# Patient Record
Sex: Female | Born: 1950 | Race: White | Hispanic: No | Marital: Married | Smoking: Former smoker
Health system: Southern US, Community
[De-identification: ages and names within clinical notes are randomized; demographics above are authoritative.]

## PROBLEM LIST (undated history)

## (undated) DIAGNOSIS — M25649 Stiffness of unspecified hand, not elsewhere classified: Secondary | ICD-10-CM

## (undated) DIAGNOSIS — E559 Vitamin D deficiency, unspecified: Secondary | ICD-10-CM

## (undated) DIAGNOSIS — E785 Hyperlipidemia, unspecified: Secondary | ICD-10-CM

## (undated) DIAGNOSIS — R209 Unspecified disturbances of skin sensation: Secondary | ICD-10-CM

## (undated) HISTORY — DX: Vitamin D deficiency, unspecified: E55.9

## (undated) HISTORY — DX: Unspecified disturbances of skin sensation: R20.9

## (undated) HISTORY — DX: Stiffness of unspecified hand, not elsewhere classified: M25.649

## (undated) HISTORY — DX: Hyperlipidemia, unspecified: E78.5

---

## 2012-05-21 ENCOUNTER — Ambulatory Visit (INDEPENDENT_AMBULATORY_CARE_PROVIDER_SITE_OTHER): Payer: BC Managed Care – PPO | Admitting: Internal Medicine

## 2012-05-21 DIAGNOSIS — R0602 Shortness of breath: Secondary | ICD-10-CM

## 2012-05-21 LAB — PULMONARY FUNCTION TEST

## 2012-05-21 NOTE — Progress Notes (Signed)
PFT done today. 

## 2012-06-03 ENCOUNTER — Encounter: Payer: Self-pay | Admitting: Internal Medicine

## 2013-06-08 ENCOUNTER — Other Ambulatory Visit (HOSPITAL_COMMUNITY): Payer: Self-pay | Admitting: Family Medicine

## 2013-06-08 DIAGNOSIS — Z1231 Encounter for screening mammogram for malignant neoplasm of breast: Secondary | ICD-10-CM

## 2013-07-03 ENCOUNTER — Ambulatory Visit (HOSPITAL_COMMUNITY)
Admission: RE | Admit: 2013-07-03 | Discharge: 2013-07-03 | Disposition: A | Discharge status: Home / Self Care | Payer: BC Managed Care – PPO | Source: Ambulatory Visit | Attending: Family Medicine | Admitting: Family Medicine

## 2013-07-03 DIAGNOSIS — Z1231 Encounter for screening mammogram for malignant neoplasm of breast: Secondary | ICD-10-CM | POA: Insufficient documentation

## 2013-12-18 ENCOUNTER — Encounter: Payer: Self-pay | Admitting: *Deleted

## 2015-03-03 ENCOUNTER — Other Ambulatory Visit: Payer: Self-pay | Admitting: Family Medicine

## 2015-03-03 DIAGNOSIS — R221 Localized swelling, mass and lump, neck: Secondary | ICD-10-CM

## 2015-03-15 ENCOUNTER — Ambulatory Visit
Admission: RE | Admit: 2015-03-15 | Discharge: 2015-03-15 | Disposition: A | Discharge status: Home / Self Care | Payer: BLUE CROSS/BLUE SHIELD | Source: Ambulatory Visit | Attending: Family Medicine | Admitting: Family Medicine

## 2015-03-15 DIAGNOSIS — R221 Localized swelling, mass and lump, neck: Secondary | ICD-10-CM

## 2015-04-02 ENCOUNTER — Other Ambulatory Visit: Payer: Self-pay | Admitting: Family Medicine

## 2015-04-02 DIAGNOSIS — E041 Nontoxic single thyroid nodule: Secondary | ICD-10-CM

## 2015-04-06 ENCOUNTER — Ambulatory Visit
Admission: RE | Admit: 2015-04-06 | Discharge: 2015-04-06 | Disposition: A | Discharge status: Home / Self Care | Payer: BLUE CROSS/BLUE SHIELD | Source: Ambulatory Visit | Attending: Family Medicine | Admitting: Family Medicine

## 2015-04-06 ENCOUNTER — Other Ambulatory Visit (HOSPITAL_COMMUNITY)
Admission: RE | Admit: 2015-04-06 | Discharge: 2015-04-06 | Disposition: A | Discharge status: Home / Self Care | Payer: BLUE CROSS/BLUE SHIELD | Source: Ambulatory Visit | Attending: Radiology | Admitting: Radiology

## 2015-04-06 DIAGNOSIS — E041 Nontoxic single thyroid nodule: Secondary | ICD-10-CM | POA: Diagnosis not present

## 2015-04-19 ENCOUNTER — Other Ambulatory Visit: Payer: Self-pay | Admitting: Family Medicine

## 2015-04-19 DIAGNOSIS — E041 Nontoxic single thyroid nodule: Secondary | ICD-10-CM

## 2015-05-18 ENCOUNTER — Ambulatory Visit
Admission: RE | Admit: 2015-05-18 | Discharge: 2015-05-18 | Disposition: A | Discharge status: Home / Self Care | Payer: BLUE CROSS/BLUE SHIELD | Source: Ambulatory Visit | Attending: Family Medicine | Admitting: Family Medicine

## 2015-05-18 ENCOUNTER — Other Ambulatory Visit: Payer: Self-pay | Admitting: Family Medicine

## 2015-05-18 DIAGNOSIS — E041 Nontoxic single thyroid nodule: Secondary | ICD-10-CM

## 2016-03-13 ENCOUNTER — Other Ambulatory Visit: Payer: Self-pay | Admitting: Family Medicine

## 2016-03-13 DIAGNOSIS — E041 Nontoxic single thyroid nodule: Secondary | ICD-10-CM

## 2021-01-11 ENCOUNTER — Emergency Department (HOSPITAL_COMMUNITY): Payer: Medicare Other

## 2021-01-11 ENCOUNTER — Encounter (HOSPITAL_COMMUNITY): Payer: Self-pay

## 2021-01-11 ENCOUNTER — Other Ambulatory Visit: Payer: Self-pay

## 2021-01-11 ENCOUNTER — Emergency Department (HOSPITAL_COMMUNITY)
Admission: EM | Admit: 2021-01-11 | Discharge: 2021-01-11 | Disposition: A | Discharge status: Home / Self Care | Payer: Medicare Other | Attending: Emergency Medicine | Admitting: Emergency Medicine

## 2021-01-11 ENCOUNTER — Other Ambulatory Visit: Payer: Self-pay | Admitting: Physician Assistant

## 2021-01-11 ENCOUNTER — Emergency Department (INDEPENDENT_AMBULATORY_CARE_PROVIDER_SITE_OTHER): Payer: Medicare Other

## 2021-01-11 DIAGNOSIS — R0602 Shortness of breath: Secondary | ICD-10-CM | POA: Diagnosis present

## 2021-01-11 DIAGNOSIS — R002 Palpitations: Secondary | ICD-10-CM | POA: Insufficient documentation

## 2021-01-11 DIAGNOSIS — Z87891 Personal history of nicotine dependence: Secondary | ICD-10-CM | POA: Diagnosis not present

## 2021-01-11 DIAGNOSIS — R42 Dizziness and giddiness: Secondary | ICD-10-CM | POA: Diagnosis not present

## 2021-01-11 LAB — BASIC METABOLIC PANEL
Anion gap: 7 (ref 5–15)
BUN: 15 mg/dL (ref 8–23)
CO2: 25 mmol/L (ref 22–32)
Calcium: 9.2 mg/dL (ref 8.9–10.3)
Chloride: 106 mmol/L (ref 98–111)
Creatinine, Ser: 0.65 mg/dL (ref 0.44–1.00)
GFR, Estimated: >60 mL/min (ref >60)
Glucose, Bld: 109 mg/dL — ABNORMAL HIGH (ref 70–99)
Potassium: 4.4 mmol/L (ref 3.5–5.1)
Sodium: 138 mmol/L (ref 135–145)

## 2021-01-11 LAB — CBC
HCT: 41.2 % (ref 36.0–46.0)
Hemoglobin: 13.5 g/dL (ref 12.0–15.0)
MCH: 31.5 pg (ref 26.0–34.0)
MCHC: 32.8 g/dL (ref 30.0–36.0)
MCV: 96 fL (ref 80.0–100.0)
Platelets: 216 10*3/uL (ref 150–400)
RBC: 4.29 MIL/uL (ref 3.87–5.11)
RDW: 12.7 % (ref 11.5–15.5)
WBC: 6.8 10*3/uL (ref 4.0–10.5)
nRBC: 0 % (ref 0.0–0.2)

## 2021-01-11 LAB — TROPONIN I (HIGH SENSITIVITY)
Troponin I (High Sensitivity): 4 ng/L (ref <18)
Troponin I (High Sensitivity): 3 ng/L (ref <18)

## 2021-01-11 LAB — TSH: TSH: 1.399 u[IU]/mL (ref 0.350–4.500)

## 2021-01-11 LAB — MAGNESIUM: Magnesium: 2.1 mg/dL (ref 1.7–2.4)

## 2021-01-11 NOTE — Progress Notes (Unsigned)
Enrolled patient for a 14 day Zio XT monitor to be mailed to: [address] [place] [address]  New patient appt being set up

## 2021-01-11 NOTE — ED Provider Notes (Signed)
[facility] EMERGENCY DEPARTMENT Provider Note   CSN: [id number] Arrival date & time: 01/11/21  4098     History No chief complaint on file.   Rita Petersen is a 70 y.o. female.  HPI Patient is a 69 year old female she states that she has a past medical history of vitamin D deficiency but no other medical problems.  She denies any history of heart disease, heart problems, thyroid problems, no history of heart failure.  She states that for the past 3 months she has been having episodes of heart fluttering sensation.  She states that this goes along with some shortness of breath that she experiences with for her palpitations.  She states that she is having symptoms of heart palpitations/heart fluttering sensations on a nearly daily basis.  Has not passed out but does occasionally feel lightheaded.  Denies any nausea vomiting or chest pain.  She states that she has not seen her primary care provider in approximately 5 years.  States that she does not have any medical problems however and always had reassuring labs and vital signs.  She has never been on blood pressure medicine.  She denies any history of strokes or heart attacks or other heart disease.     Past Medical History:  Diagnosis Date   Disturbance of skin sensation    Other and unspecified hyperlipidemia    Stiffness of joint, not elsewhere classified, hand    Unspecified vitamin D deficiency     There are no problems to display for this patient.   History reviewed. No pertinent surgical history.   OB History   No obstetric history on file.     Family History  Problem Relation Age of Onset   Heart Problems Father        CABG   Hypertension Father    Alzheimer's disease Mother     Social History   Tobacco Use   Smoking status: Former  Substance Use Topics   Alcohol use: Yes   Drug use: No    Home Medications Prior to Admission medications   Medication Sig Start Date End Date  Taking? Authorizing Provider  Ibuprofen (MOTRIN IB PO) Take 2 tablets by mouth daily as needed (pain).   Yes [provider]  Multiple Vitamin (MULTIVITAMIN) capsule Take 1 capsule by mouth daily.   Yes [provider]    Allergies    Patient has no known allergies.  Review of Systems   Review of Systems  Constitutional:  Positive for fatigue. Negative for chills and fever.  HENT:  Negative for congestion.   Eyes:  Negative for pain.  Respiratory:  Positive for shortness of breath. Negative for cough.   Cardiovascular:  Positive for chest pain and palpitations. Negative for leg swelling.  Gastrointestinal:  Positive for nausea. Negative for abdominal pain, diarrhea and vomiting.  Genitourinary:  Negative for dysuria.  Musculoskeletal:  Negative for myalgias.  Skin:  Negative for rash.  Neurological:  Negative for dizziness and headaches.   Physical Exam Updated Vital Signs BP 115/60   Pulse 70   Temp (!) 97.5 F (36.4 C) (Oral)   Resp 16   SpO2 100%   Physical Exam Vitals and nursing note reviewed.  Constitutional:      General: She is not in acute distress.    Comments: Pleasant well-appearing 70 year old.  In no acute distress.  Sitting comfortably in bed.  Able answer questions appropriately follow commands. No increased work of breathing. Speaking in full sentences.  HENT:     Head: Normocephalic and atraumatic.     Nose: Nose normal.     Mouth/Throat:     Mouth: Mucous membranes are moist.  Eyes:     General: No scleral icterus. Cardiovascular:     Rate and Rhythm: Normal rate and regular rhythm.     Pulses: Normal pulses.     Heart sounds: Normal heart sounds.  Pulmonary:     Effort: Pulmonary effort is normal. No respiratory distress.     Breath sounds: Normal breath sounds. No wheezing.  Chest:     Chest wall: No tenderness.  Abdominal:     Palpations: Abdomen is soft.     Tenderness: There is no abdominal tenderness. There is no right  CVA tenderness, left CVA tenderness, guarding or rebound.  Musculoskeletal:     Cervical back: Normal range of motion.     Right lower leg: No edema.     Left lower leg: No edema.  Skin:    General: Skin is warm and dry.     Capillary Refill: Capillary refill takes less than 2 seconds.  Neurological:     Mental Status: She is alert. Mental status is at baseline.  Psychiatric:        Mood and Affect: Mood normal.        Behavior: Behavior normal.    ED Results / Procedures / Treatments   Labs (all labs ordered are listed, but only abnormal results are displayed) Labs Reviewed  BASIC METABOLIC PANEL - Abnormal; Notable for the following components:      Result Value   Glucose, Bld 109 (*)    All other components within normal limits  CBC  TSH  MAGNESIUM  TROPONIN I (HIGH SENSITIVITY)  TROPONIN I (HIGH SENSITIVITY)    EKG EKG Interpretation  Date/Time:  Wednesday January 11 2021 08:16:52 EDT Ventricular Rate:  81 PR Interval:  146 QRS Duration: 86 QT Interval:  358 QTC Calculation: 415 R Axis:   71 Text Interpretation: Normal sinus rhythm Low voltage QRS Nonspecific ST abnormality Abnormal ECG Confirmed by Gloris Manchester ([id number]) on 01/11/2021 9:35:28 AM  Radiology DG Chest 2 View  Result Date: 01/11/2021 CLINICAL DATA:  Shortness of breath EXAM: CHEST - 2 VIEW COMPARISON:  None. FINDINGS: Heart and mediastinal contours are within normal limits. No focal opacities or effusions. No acute bony abnormality. IMPRESSION: No active cardiopulmonary disease. Electronically Signed   By: Charlett Nose M.D.   On: 01/11/2021 09:00    Procedures Procedures   Medications Ordered in ED Medications - No data to display  ED Course  I have reviewed the triage vital signs and the nursing notes.  Pertinent labs & imaging results that were available during my care of the patient were reviewed by me and considered in my medical decision making (see chart for details).  Clinical Course as of  01/11/21 1353  Wed Jan 11, 2021  1020 Chest x-ray without infiltrate abnormality of any note there is no cardiomegaly.  No pulmonary edema. [WF]  1237 Discussed with lab who states that second troponin is pending.  Should be resulted within 10 or 15 minutes  [WF]  1351 Discussed with cards master.  Patient will have Zio patch sent to their address. [WF]  1351 Magnesium within normal limits TSH unremarkable.  CBC and BMP without abnormality.  Troponin x2 within normal limits.  No significant delta.  Chest x-ray without any acute abnormality.  No infiltrate or widened thoracic aorta evident.  No pulmonary edema. [WF]  1352 EKG initially with some mild lateral ST depressions on repeat EKG these are not evident.  Reviewed EKG with my attending physician Dr. Durwin Nora. [WF]    Clinical Course User Index [WF] Gailen Shelter, PA   MDM Rules/Calculators/A&P                           I discussed this case with my attending physician who cosigned this note including patient's presenting symptoms, physical exam, and planned diagnostics and interventions. Attending physician stated agreement with plan or made changes to plan which were implemented.   Patient overall well-appearing 70 year old female seems to be having some Tatian's lightheadedness shortness of breath.  Concerning is about some of her shortness of breath is somewhat exertional she seems to have somewhat decreased exercise tolerance however she also describes result is quite active and seems to be doing her activities of daily living without any difficulty.  She is no history of smoking.  She is not having any chest pain.  No recreational drug use no alcohol use.  Patient was ambulated with normal SPO2 and no tachycardia.  Does not have any significant risk factors for PE. No recent surgeries, hospitalization, long travel, hemoptysis, estrogen containing OCP, cancer history.  No unilateral leg swelling.  No history of PE or VTE.   Physical  exam is overall quite reassuring with no lower extremity edema JVP or crackles.  She is not in an irregular rhythm.  She very well may be experiencing premature beats this is consistent with patient's recent heavy caffeine use which she quit cold [place] 2 weeks ago.  She was drinking 4 glasses/cups of coffee per day.  She seems to have quite a bit of stress at work.  She is given very strict return precautions specifically for any chest pain, worsening shortness of breath, lightheadedness or passing out.  Or any other new or concerning symptoms  Final Clinical Impression(s) / ED Diagnoses Final diagnoses:  Palpitations  SOB (shortness of breath)    Rx / DC Orders ED Discharge Orders     None        Gailen Shelter, [place] 01/11/21 1355    Gloris Manchester, MD 01/12/21 [phone]

## 2021-01-11 NOTE — Discharge Instructions (Addendum)
Please return to the emergency department for any new or concerning symptoms as we discussed.  Your work-up today was reassuring.  Your EKG, chest x-ray, labs were all reassuring without any acute abnormality.  As we discussed I would like you to follow-up with a cardiologist I given you the information for our cardiology group here at [facility].  You should receive a monitor in the mail

## 2021-01-11 NOTE — Progress Notes (Signed)
Pt presented to [facility] and complained of palpitations. EDP requested zio patch and follow up.   Address for zio:  [address] [place], [place] [address]

## 2021-01-11 NOTE — ED Triage Notes (Signed)
Patient complains of breathing issues x 1 month and thinks her heart rate all over the place. Reports intermittent chest fluttering and weird sensations in chest for a few weeks. Alert and oriented on assesment

## 2021-01-11 NOTE — ED Notes (Signed)
Pt was able to ambulate in the room with a steady gait and maintained her O2 saturation at 97-100% RA. Prior to standing up, pt stated to this tech that she still felt SOB when laying in the bed. When ambulating, pt stated that her SOB felt much worse than when laying in the bed. Stevphen Meuse, PA aware.

## 2021-01-12 NOTE — Progress Notes (Addendum)
Monitor mailed on 01/11/21./cy

## 2021-01-15 DIAGNOSIS — R002 Palpitations: Secondary | ICD-10-CM | POA: Diagnosis not present

## 2021-02-02 ENCOUNTER — Encounter: Payer: Self-pay | Admitting: Cardiology

## 2021-02-02 ENCOUNTER — Other Ambulatory Visit: Payer: Self-pay

## 2021-02-02 ENCOUNTER — Ambulatory Visit (INDEPENDENT_AMBULATORY_CARE_PROVIDER_SITE_OTHER): Payer: Medicare Other | Admitting: Cardiology

## 2021-02-02 VITALS — BP 125/76 | HR 73 | Ht 62.0 in | Wt 132.0 lb

## 2021-02-02 DIAGNOSIS — R0600 Dyspnea, unspecified: Secondary | ICD-10-CM

## 2021-02-02 DIAGNOSIS — R9431 Abnormal electrocardiogram [ECG] [EKG]: Secondary | ICD-10-CM | POA: Diagnosis not present

## 2021-02-02 DIAGNOSIS — Z13220 Encounter for screening for lipoid disorders: Secondary | ICD-10-CM | POA: Diagnosis not present

## 2021-02-02 DIAGNOSIS — R002 Palpitations: Secondary | ICD-10-CM

## 2021-02-02 DIAGNOSIS — R0609 Other forms of dyspnea: Secondary | ICD-10-CM

## 2021-02-02 LAB — LIPID PANEL
Chol/HDL Ratio: 3.1 ratio (ref 0.0–4.4)
Cholesterol, Total: 249 mg/dL — ABNORMAL HIGH (ref 100–199)
HDL: 81 mg/dL (ref >39)
LDL Chol Calc (NIH): 160 mg/dL — ABNORMAL HIGH (ref 0–99)
Triglycerides: 51 mg/dL (ref 0–149)
VLDL Cholesterol Cal: 8 mg/dL (ref 5–40)

## 2021-02-02 MED ORDER — METOPROLOL TARTRATE 100 MG PO TABS
ORAL_TABLET | ORAL | 0 refills | Status: AC
Start: 2021-02-02 — End: ?

## 2021-02-02 NOTE — Progress Notes (Signed)
Cardiology Office Note:    Date:  02/02/2021   ID:  Rita Petersen, DOB 1951-01-04, MRN [id number]  PCP:  Mila Palmer, MD  Cardiologist:  None  Electrophysiologist:  None   Referring MD: Gloris Manchester, MD   Chief Complaint  Patient presents with   Palpitations    History of Present Illness:    Rita Petersen is a 70 y.o. female with no significant past medical history who presents as an ED follow-up for palpitations.  She was seen in the ED on 01/11/2021 with palpitations.  Also reported exertional shortness of breath.  Work-up (including chemistry, CBC, TSH, troponins, EKG) was unremarkable.  Cardiac monitor was ordered and she was referred to cardiology.  She reports that she has been having dyspnea.  Occurs at rest or with exertion.  She does not exercise.  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  Reports occasional chest pain that she describes as sharp pain lasting for few seconds.  Has also been having issues with lightheadedness but denies any syncope.  Does report palpitations that she describes as feeling her heart is pounding.  Worse cardiac monitor x2 weeks after the ED visit.  Results pending.  She smoked for 12 years about 1 pack/day, quit age 10.  Family history includes father had CABG in the 47s.   Past Medical History:  Diagnosis Date   Disturbance of skin sensation    Other and unspecified hyperlipidemia    Stiffness of joint, not elsewhere classified, hand    Unspecified vitamin D deficiency     No past surgical history on file.  Current Medications: Current Meds  Medication Sig   metoprolol tartrate (LOPRESSOR) 100 MG tablet Take 1 tablet (100 mg) two hours prior to CT scan     Allergies:   Patient has no known allergies.   Social History   Socioeconomic History   Marital status: Married    Spouse name: Not on file   Number of children: Not on file   Years of education: Not on file   Highest education level: Not on file  Occupational  History   Not on file  Tobacco Use   Smoking status: Former   Smokeless tobacco: Not on file  Substance and Sexual Activity   Alcohol use: Yes   Drug use: No   Sexual activity: Not on file  Other Topics Concern   Not on file  Social History Narrative   Not on file   Social Determinants of Health   Financial Resource Strain: Not on file  Food Insecurity: Not on file  Transportation Needs: Not on file  Physical Activity: Not on file  Stress: Not on file  Social Connections: Not on file     Family History: The patient's family history includes Alzheimer's disease in her mother; Heart Problems in her father; Hypertension in her father.  ROS:   Please see the history of present illness.     All other systems reviewed and are negative.  EKGs/Labs/Other Studies Reviewed:    The following studies were reviewed today:   EKG:  EKG is  ordered today.  The ekg ordered today demonstrates normal sinus rhythm, less than 1 mm ST depressions in leads II, III,V3-6  Recent Labs: 01/11/2021: BUN 15; Creatinine, Ser 0.65; Hemoglobin 13.5; Magnesium 2.1; Platelets 216; Potassium 4.4; Sodium 138; TSH 1.399  Recent Lipid Panel No results found for: CHOL, TRIG, HDL, CHOLHDL, VLDL, LDLCALC, LDLDIRECT  Physical Exam:    VS:  BP 125/76   Pulse 73   Ht 5\' 2"  (1.575 m)   Wt 132 lb (59.9 kg)   SpO2 100%   BMI 24.14 kg/m     Wt Readings from Last 3 Encounters:  02/02/21 132 lb (59.9 kg)     GEN:  Well nourished, well developed in no acute distress HEENT: Normal NECK: No JVD; No carotid bruits LYMPHATICS: No lymphadenopathy CARDIAC: RRR, no murmurs, rubs, gallops RESPIRATORY:  Clear to auscultation without rales, wheezing or rhonchi  ABDOMEN: Soft, non-tender, non-distended MUSCULOSKELETAL:  No edema; No deformity  SKIN: Warm and dry NEUROLOGIC:  Alert and oriented x 3 PSYCHIATRIC:  Normal affect   ASSESSMENT:    1. DOE (dyspnea on exertion)   2. Abnormal EKG   3. Palpitations    4. Lipid screening    PLAN:    Dyspnea: Check echocardiogram to rule out structural heart disease.  She does have CAD risk factors (age, former tobacco use).  EKG with <85mm ST depressions.  Recommend coronary CTA to evaluate for obstructive CAD.  Will give Lopressor 100 mg prior to study  Palpitations Description concerning for arrhythmia.  She wore Zio patch x2 weeks, will follow-up results.  Lipid screening: Check lipid panel  RTC in 6 months   Medication Adjustments/Labs and Tests Ordered: Current medicines are reviewed at length with the patient today.  Concerns regarding medicines are outlined above.  Orders Placed This Encounter  Procedures   CT CORONARY MORPH W/CTA COR W/SCORE W/CA W/CM &/OR WO/CM   Lipid panel   EKG 12-Lead   ECHOCARDIOGRAM COMPLETE   Meds ordered this encounter  Medications   metoprolol tartrate (LOPRESSOR) 100 MG tablet    Sig: Take 1 tablet (100 mg) two hours prior to CT scan    Dispense:  1 tablet    Refill:  0    Patient Instructions  Medication Instructions:  Your physician recommends that you continue on your current medications as directed. Please refer to the Current Medication list given to you today.  Lab Work: Lipid today  If you have labs (blood work) drawn today and your tests are completely normal, you will receive your results only by: MyChart Message (if you have MyChart) OR A paper copy in the mail If you have any lab test that is abnormal or we need to change your treatment, we will call you to review the results.   Testing/Procedures: Coronary CTA-see instructions below  Your physician has requested that you have an echocardiogram. Echocardiography is a painless test that uses sound waves to create images of your heart. It provides your doctor with information about the size and shape of your heart and how well your heart's chambers and valves are working. This procedure takes approximately one hour. There are no  restrictions for this procedure.  This will be done at our [facility] location:  [facility] [address]  Follow-Up: At [facility], you and your health needs are our priority.  As part of our continuing mission to provide you with exceptional heart care, we have created designated Provider Care Teams.  These Care Teams include your primary Cardiologist (physician) and Advanced Practice Providers (APPs -  Physician Assistants and Nurse Practitioners) who all work together to provide you with the care you need, when you need it.  We recommend signing up for the patient portal called "MyChart".  Sign up information is provided on this After Visit Summary.  MyChart is used to connect with patients  for Virtual Visits (Telemedicine).  Patients are able to view lab/test results, encounter notes, upcoming appointments, etc.  Non-urgent messages can be sent to your provider as well.   To learn more about what you can do with MyChart, go to [email or web address].    Your next appointment:   6 month(s)  The format for your next appointment:   In Person  Provider:   Epifanio Lesches, MD   Other Instructions   Your cardiac CT will be scheduled at one of the below locations:   [facility] [address] [place], [place] [address] [phone]  If scheduled at [facility], please arrive at the [facility] main entrance (entrance A) of [facility] 30 minutes prior to test start time. Proceed to the [facility] Radiology Department (first floor) to check-in and test prep.  Please follow these instructions carefully (unless otherwise directed):  On the Night Before the Test: Be sure to Drink plenty of water. Do not consume any caffeinated/decaffeinated beverages or chocolate 12 hours prior to your test. Do not take any antihistamines 12 hours prior to your test.  On the Day of the Test: Drink plenty of water until 1 hour prior to the test. Do  not eat any food 4 hours prior to the test. You may take your regular medications prior to the test.  Take metoprolol (Lopressor) two hours prior to test. FEMALES- please wear underwire-free bra if available, avoid dresses & tight clothing      After the Test: Drink plenty of water. After receiving IV contrast, you may experience a mild flushed feeling. This is normal. On occasion, you may experience a mild rash up to 24 hours after the test. This is not dangerous. If this occurs, you can take Benadryl 25 mg and increase your fluid intake. If you experience trouble breathing, this can be serious. If it is severe call 911 IMMEDIATELY. If it is mild, please call our office. If you take any of these medications: Glipizide/Metformin, Avandament, Glucavance, please do not take 48 hours after completing test unless otherwise instructed.  Please allow 2-4 weeks for scheduling of routine cardiac CTs. Some insurance companies require a pre-authorization which may delay scheduling of this test.   For non-scheduling related questions, please contact the cardiac imaging nurse navigator should you have any questions/concerns: Rockwell Alexandria, Cardiac Imaging Nurse Navigator Larey Brick, Cardiac Imaging Nurse Navigator [facility] Direct Office Dial: [phone]   For scheduling needs, including cancellations and rescheduling, please call [place], [phone].    Signed, Little Ishikawa, MD  02/02/2021 12:37 PM    [facility]

## 2021-02-02 NOTE — Patient Instructions (Signed)
Medication Instructions:  Your physician recommends that you continue on your current medications as directed. Please refer to the Current Medication list given to you today.  Lab Work: Lipid today  If you have labs (blood work) drawn today and your tests are completely normal, you will receive your results only by: MyChart Message (if you have MyChart) OR A paper copy in the mail If you have any lab test that is abnormal or we need to change your treatment, we will call you to review the results.   Testing/Procedures: Coronary CTA-see instructions below  Your physician has requested that you have an echocardiogram. Echocardiography is a painless test that uses sound waves to create images of your heart. It provides your doctor with information about the size and shape of your heart and how well your heart's chambers and valves are working. This procedure takes approximately one hour. There are no restrictions for this procedure.  This will be done at our [facility] location:  [employer] [address]  Follow-Up: At [facility], you and your health needs are our priority.  As part of our continuing mission to provide you with exceptional heart care, we have created designated Provider Care Teams.  These Care Teams include your primary Cardiologist (physician) and Advanced Practice Providers (APPs -  Physician Assistants and Nurse Practitioners) who all work together to provide you with the care you need, when you need it.  We recommend signing up for the patient portal called "MyChart".  Sign up information is provided on this After Visit Summary.  MyChart is used to connect with patients for Virtual Visits (Telemedicine).  Patients are able to view lab/test results, encounter notes, upcoming appointments, etc.  Non-urgent messages can be sent to your provider as well.   To learn more about what you can do with MyChart, go to [email or web address].    Your next appointment:    6 month(s)  The format for your next appointment:   In Person  Provider:   Epifanio Lesches, MD   Other Instructions   Your cardiac CT will be scheduled at one of the below locations:   [facility] [address] [place], [place] [address] [phone]  If scheduled at [facility], please arrive at the [facility] main entrance (entrance A) of [facility] 30 minutes prior to test start time. Proceed to the [facility] Radiology Department (first floor) to check-in and test prep.  Please follow these instructions carefully (unless otherwise directed):  On the Night Before the Test: Be sure to Drink plenty of water. Do not consume any caffeinated/decaffeinated beverages or chocolate 12 hours prior to your test. Do not take any antihistamines 12 hours prior to your test.  On the Day of the Test: Drink plenty of water until 1 hour prior to the test. Do not eat any food 4 hours prior to the test. You may take your regular medications prior to the test.  Take metoprolol (Lopressor) two hours prior to test. FEMALES- please wear underwire-free bra if available, avoid dresses & tight clothing      After the Test: Drink plenty of water. After receiving IV contrast, you may experience a mild flushed feeling. This is normal. On occasion, you may experience a mild rash up to 24 hours after the test. This is not dangerous. If this occurs, you can take Benadryl 25 mg and increase your fluid intake. If you experience trouble breathing, this can be serious. If  it is severe call 911 IMMEDIATELY. If it is mild, please call our office. If you take any of these medications: Glipizide/Metformin, Avandament, Glucavance, please do not take 48 hours after completing test unless otherwise instructed.  Please allow 2-4 weeks for scheduling of routine cardiac CTs. Some insurance companies require a pre-authorization which may delay scheduling of this test.   For  non-scheduling related questions, please contact the cardiac imaging nurse navigator should you have any questions/concerns: Rockwell Alexandria, Cardiac Imaging Nurse Navigator Larey Brick, Cardiac Imaging Nurse Navigator [facility] Direct Office Dial: [phone]   For scheduling needs, including cancellations and rescheduling, please call [place], [phone].

## 2021-02-24 ENCOUNTER — Ambulatory Visit (HOSPITAL_COMMUNITY): Payer: Medicare Other

## 2021-03-03 ENCOUNTER — Other Ambulatory Visit (HOSPITAL_BASED_OUTPATIENT_CLINIC_OR_DEPARTMENT_OTHER): Payer: Medicare Other

## 2021-03-20 ENCOUNTER — Encounter (HOSPITAL_COMMUNITY): Payer: Self-pay | Admitting: Cardiology

## 2021-03-30 ENCOUNTER — Telehealth (HOSPITAL_COMMUNITY): Payer: Self-pay | Admitting: Cardiology

## 2021-03-30 NOTE — Telephone Encounter (Signed)
Just an FYI. We have made several attempts to contact this patient including sending a letter to schedule or reschedule their echocardiogram. We will be removing the patient from the echo WQ.  MAILED LETTER LBW  03/20/21 [facility] to schedule x 3 @ 10:33/LBW  03/15/21 [facility] to schedule @ 10:42am LBW  03/08/21 [facility] to schedule @ 11:09am LBW  02/24/21 We moved pt appt due to tech out sick. patient cancelled rescheduled appt due to she did not have transportation and will call us back to rescedule after she talks to him/LBW        Thank you

## 2021-06-20 ENCOUNTER — Telehealth: Payer: Self-pay | Admitting: Cardiology

## 2021-06-20 ENCOUNTER — Encounter: Payer: Self-pay | Admitting: Cardiology

## 2021-06-20 NOTE — Telephone Encounter (Signed)
03/30/21  11:54 AM Rita Petersen B  Note: Just an FYI. We have made several attempts to contact this patient including sending a letter to schedule or reschedule their echocardiogram. We will be removing the patient from the echo WQ.   MAILED LETTER LBW  03/20/21 [facility] to schedule x 3 @ 10:33/LBW  03/15/21 [facility] to schedule @ 10:42am LBW  03/08/21 [facility] to schedule @ 11:09am LBW  02/24/21 We moved pt appt due to tech out sick. patient cancelled rescheduled appt due to she did not have transportation and will call us back to rescedule after she talks to him/LBW  ---------------------- Returned call to pt she states that she was unable to have the ECHO done last year, for many different reasons. She would like to schedule the ECHO now. Pt will send her new insurance cards (she does have additional new insurance).

## 2021-06-20 NOTE — Telephone Encounter (Signed)
° °  Pt would like to schedule her echo, however, the order has been cancelled. She wanted to know if Dr. Bjorn Pippin still wants her to get an echo

## 2021-06-21 NOTE — Telephone Encounter (Signed)
There is not a new order placed. I do not know about reopening the previous order.

## 2021-06-22 NOTE — Telephone Encounter (Signed)
Yes would recommend getting an echo

## 2021-06-22 NOTE — Telephone Encounter (Signed)
Called pt to let her know her insurance has been updated and they should be reaching out to get to set up the echo. Dr. Bjorn Pippin still wants her to get the testing done. She verbalized understanding and thanked me for calling her.

## 2021-07-06 ENCOUNTER — Ambulatory Visit (HOSPITAL_COMMUNITY): Payer: Medicare Other | Attending: Cardiology

## 2021-07-06 ENCOUNTER — Other Ambulatory Visit: Payer: Self-pay

## 2021-07-06 DIAGNOSIS — R9431 Abnormal electrocardiogram [ECG] [EKG]: Secondary | ICD-10-CM | POA: Insufficient documentation

## 2021-07-06 DIAGNOSIS — R0609 Other forms of dyspnea: Secondary | ICD-10-CM | POA: Diagnosis not present

## 2021-07-06 LAB — ECHOCARDIOGRAM COMPLETE
Area-P 1/2: 3.37 cm2
S' Lateral: 2.7 cm

## 2021-07-13 ENCOUNTER — Encounter: Payer: Self-pay | Admitting: Cardiology

## 2021-07-13 NOTE — Telephone Encounter (Signed)
Spoke to patient. Appointment schedule for 09/28/21 at 2:20 pm Patient verbalized understanding.

## 2021-09-24 NOTE — Progress Notes (Signed)
?Cardiology Office Note:   ? ?Date:  09/28/2021  ? ?ID:  Rita Petersen, DOB 1950/08/04, MRN [id number] ? ?PCP:  Mila Palmer, MD  ?Cardiologist:  None  ?Electrophysiologist:  None  ? ?Referring MD: Mila Palmer, MD  ? ?Chief Complaint  ?Patient presents with  ? Shortness of Breath  ? ? ?History of Present Illness:   ? ?Rita Petersen is a 71 y.o. female with no significant past medical history who presents for follow-up.  She initially presented as an ED follow-up for palpitations.  She was seen in the ED on 01/11/2021 with palpitations.  Also reported exertional shortness of breath.  Work-up (including chemistry, CBC, TSH, troponins, EKG) was unremarkable.  Cardiac monitor was ordered and she was referred to cardiology.  She reports that she has been having dyspnea.  Occurs at rest or with exertion.  She does not exercise.  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  Reports occasional chest pain that she describes as sharp pain lasting for few seconds.  Has also been having issues with lightheadedness but denies any syncope.  Does report palpitations that she describes as feeling her heart is pounding.  Worse cardiac monitor x2 weeks after the ED visit.  Results pending.  She smoked for 12 years about 1 pack/day, quit age 38.  Family history includes father had CABG in 19s. ? ?Zio patch x13 days on 02/02/2021 showed no significant arrhythmias.  Echocardiogram on 07/06/2021 showed normal biventricular function, mild to moderate MR.  Coronary CTA was ordered at initial clinic visit but has not been done. ? ?Since last clinic visit, she reports she continues to have issues with shortness of breath.  Also reports intermittent chest pain which she describes as dull pain in center of her chest that last for few seconds.  She denies any lightheadedness or syncope.  She reports lower extremity edema after standing all day at work.  Reports palpitations have resolved. ? ? ?Past Medical History:  ?Diagnosis Date  ?  Disturbance of skin sensation   ? Other and unspecified hyperlipidemia   ? Stiffness of joint, not elsewhere classified, hand   ? Unspecified vitamin D deficiency   ? ? ?No past surgical history on file. ? ?Current Medications: ?No outpatient medications have been marked as taking for the 09/28/21 encounter (Office Visit) with Little Ishikawa, MD.  ?  ? ?Allergies:   Cortisone  ? ?Social History  ? ?Socioeconomic History  ? Marital status: Married  ?  Spouse name: Not on file  ? Number of children: Not on file  ? Years of education: Not on file  ? Highest education level: Not on file  ?Occupational History  ? Not on file  ?Tobacco Use  ? Smoking status: Former  ? Smokeless tobacco: Not on file  ?Substance and Sexual Activity  ? Alcohol use: Yes  ? Drug use: No  ? Sexual activity: Not on file  ?Other Topics Concern  ? Not on file  ?Social History Narrative  ? Not on file  ? ?Social Determinants of Health  ? ?Financial Resource Strain: Not on file  ?Food Insecurity: Not on file  ?Transportation Needs: Not on file  ?Physical Activity: Not on file  ?Stress: Not on file  ?Social Connections: Not on file  ?  ? ?Family History: ?The patient's family history includes Alzheimer's disease in her mother; Heart Problems in her father; Hypertension in her father. ? ?ROS:   ?Please see the history of present  illness.    ? All other systems reviewed and are negative. ? ?EKGs/Labs/Other Studies Reviewed:   ? ?The following studies were reviewed today: ? ? ?EKG:   ?09/28/21: NSR, rate 83, low voltage ? ?Recent Labs: ?01/11/2021: BUN 15; Creatinine, Ser 0.65; Hemoglobin 13.5; Magnesium 2.1; Platelets 216; Potassium 4.4; Sodium 138; TSH 1.399  ?Recent Lipid Panel ?   ?Component Value Date/Time  ? CHOL 249 (H) 02/02/2021 1141  ? TRIG 51 02/02/2021 1141  ? HDL 81 02/02/2021 1141  ? CHOLHDL 3.1 02/02/2021 1141  ? LDLCALC 160 (H) 02/02/2021 1141  ? ? ?Physical Exam:   ? ?VS:  BP 102/68 (BP Location: Left Arm, Patient Position:  Sitting, Cuff Size: Normal)   Pulse 83   Ht 5' 2.5" (1.588 m)   Wt 134 lb (60.8 kg)   SpO2 97%   BMI 24.12 kg/m?    ? ?Wt Readings from Last 3 Encounters:  ?09/28/21 134 lb (60.8 kg)  ?02/02/21 132 lb (59.9 kg)  ?  ? ?GEN:  Well nourished, well developed in no acute distress ?HEENT: Normal ?NECK: No JVD; No carotid bruits ?LYMPHATICS: No lymphadenopathy ?CARDIAC: RRR, no murmurs, rubs, gallops ?RESPIRATORY:  Clear to auscultation without rales, wheezing or rhonchi  ?ABDOMEN: Soft, non-tender, non-distended ?MUSCULOSKELETAL:  No edema; No deformity  ?SKIN: Warm and dry ?NEUROLOGIC:  Alert and oriented x 3 ?PSYCHIATRIC:  Normal affect  ? ?ASSESSMENT:   ? ?1. DOE (dyspnea on exertion)   ?2. Palpitations   ?3. Mitral valve insufficiency, unspecified etiology   ?4. Hyperlipidemia, unspecified hyperlipidemia type   ? ? ?PLAN:   ? ?Dyspnea: Echocardiogram on 07/06/2021 showed normal biventricular function, mild to moderate MR.  Coronary CTA was ordered at initial clinic visit but has not been done..  She does have CAD risk factors (age, former tobacco use).  EKG at initial clinic visit with <43mm ST depressions.  Recommend coronary CTA to evaluate for obstructive CAD.  Will give Lopressor 100 mg prior to study ? ?Palpitations: Zio patch x13 days on 02/02/2021 showed no significant arrhythmias.  Reports palpitations have resolved. ? ?Mitral regurgitation: Mild to moderate MR on echocardiogram 07/06/2021.  Will monitor ? ?Hyperlipidemia: LDL 116 02/02/2021.  Continue ASCVD risk 6.4%.  Follow-up results of coronary CTA to decide on statin ? ?RTC in 6 months ? ? ?Medication Adjustments/Labs and Tests Ordered: ?Current medicines are reviewed at length with the patient today.  Concerns regarding medicines are outlined above.  ?Orders Placed This Encounter  ?Procedures  ? CT CORONARY MORPH W/CTA COR W/SCORE W/CA W/CM &/OR WO/CM  ? Basic Metabolic Panel (BMET)  ? EKG 12-Lead  ? ?No orders of the defined types were placed in this  encounter. ? ? ?Patient Instructions  ?Medication Instructions:  ? ?Your physician recommends that you continue on your current medications as directed. Please refer to the Current Medication list given to you today. ? ? ?*If you need a refill on your cardiac medications before your next appointment, please call your pharmacy* ? ? ?LAB:  BMET ? ?Testing/Procedures: ? ? ? ?Your cardiac CT will be scheduled at one of the below locations:  ? ?[facility] ?[address] ?[place], [place] [address] ?[phone] ? ?If scheduled at [facility], please arrive at the [facility] and Children's Entrance (Entrance C2) of [facility] 30 minutes prior to test start time. ?You can use the FREE valet parking offered at entrance C (encouraged to control the heart rate for the test)  ?  Proceed to the [facility] Radiology Department (first floor) to check-in and test prep. ? ?All radiology patients and guests should use entrance C2 at [facility], [facility], even though the hospital's physical address listed is [address]. ? ? ? ? ?Please follow these instructions carefully (unless otherwise directed): ? ? ? ?On the Night Before the Test: ?Be sure to Drink plenty of water. ?Do not consume any caffeinated/decaffeinated beverages or chocolate 12 hours prior to your test. ?Do not take any antihistamines 12 hours prior to your test. ?On the Day of the Test: ?Drink plenty of water until 1 hour prior to the test. ?Do not eat any food 4 hours prior to the test. ?You may take your regular medications prior to the test.  ?Take metoprolol (Lopressor) (100 mg) two hours prior to test. ?FEMALES- please wear underwire-free bra if available, avoid dresses & tight clothing ? ?   After the Test: ?Drink plenty of water. ?After receiving IV contrast, you may experience a mild flushed feeling. This is normal. ?On occasion, you may experience a mild rash up to 24 hours after the  test. This is not dangerous. If this occurs, you can take Benadryl 25 mg and increase your fluid intake. ?If you experience trouble breathing, this can be serious. If it is severe call 911 IMMEDIATELY. If it is m

## 2021-09-28 ENCOUNTER — Encounter: Payer: Self-pay | Admitting: Cardiology

## 2021-09-28 ENCOUNTER — Ambulatory Visit: Payer: Medicare Other | Admitting: Cardiology

## 2021-09-28 VITALS — BP 102/68 | HR 83 | Ht 62.5 in | Wt 134.0 lb

## 2021-09-28 DIAGNOSIS — R0609 Other forms of dyspnea: Secondary | ICD-10-CM

## 2021-09-28 DIAGNOSIS — E785 Hyperlipidemia, unspecified: Secondary | ICD-10-CM | POA: Diagnosis not present

## 2021-09-28 DIAGNOSIS — R002 Palpitations: Secondary | ICD-10-CM

## 2021-09-28 DIAGNOSIS — I340 Nonrheumatic mitral (valve) insufficiency: Secondary | ICD-10-CM

## 2021-09-28 NOTE — Patient Instructions (Addendum)
Medication Instructions:  ? ?Your physician recommends that you continue on your current medications as directed. Please refer to the Current Medication list given to you today. ? ? ?*If you need a refill on your cardiac medications before your next appointment, please call your pharmacy* ? ? ?LAB:  BMET ? ?Testing/Procedures: ? ? ? ?Your cardiac CT will be scheduled at one of the below locations:  ? ?[facility] ?[address] ?[place], [place] [address] ?[phone] ? ?If scheduled at [facility], please arrive at the [facility] and Children's Entrance (Entrance C2) of [facility] 30 minutes prior to test start time. ?You can use the FREE valet parking offered at entrance C (encouraged to control the heart rate for the test)  ?Proceed to the [facility] Radiology Department (first floor) to check-in and test prep. ? ?All radiology patients and guests should use entrance C2 at [facility], accessed from [facility], even though the hospital's physical address listed is [address] ? ? ? ? ?Please follow these instructions carefully (unless otherwise directed): ? ? ? ?On the Night Before the Test: ?Be sure to Drink plenty of water. ?Do not consume any caffeinated/decaffeinated beverages or chocolate 12 hours prior to your test. ?Do not take any antihistamines 12 hours prior to your test. ?On the Day of the Test: ?Drink plenty of water until 1 hour prior to the test. ?Do not eat any food 4 hours prior to the test. ?You may take your regular medications prior to the test.  ?Take metoprolol (Lopressor) (100 mg) two hours prior to test. ?FEMALES- please wear underwire-free bra if available, avoid dresses & tight clothing ? ?   After the Test: ?Drink plenty of water. ?After receiving IV contrast, you may experience a mild flushed feeling. This is normal. ?On occasion, you may experience a mild rash up to 24 hours after the test. This is not dangerous. If this  occurs, you can take Benadryl 25 mg and increase your fluid intake. ?If you experience trouble breathing, this can be serious. If it is severe call 911 IMMEDIATELY. If it is mild, please call our office. ? ?We will call to schedule your test 2-4 weeks out understanding that some insurance companies will need an authorization prior to the service being performed.  ? ?For non-scheduling related questions, please contact the cardiac imaging nurse navigator should you have any questions/concerns: ?Rockwell Alexandria, Cardiac Imaging Nurse Navigator ?Larey Brick, Cardiac Imaging Nurse Navigator ?[facility] ?Direct Office Dial: [phone]  ? ?For scheduling needs, including cancellations and rescheduling, please call [place], [phone].  ? ? ?Follow-Up: ?At [facility], you and your health needs are our priority.  As part of our continuing mission to provide you with exceptional heart care, we have created designated Provider Care Teams.  These Care Teams include your primary Cardiologist (physician) and Advanced Practice Providers (APPs -  Physician Assistants and Nurse Practitioners) who all work together to provide you with the care you need, when you need it. ? ?We recommend signing up for the patient portal called "MyChart".  Sign up information is provided on this After Visit Summary.  MyChart is used to connect with patients for Virtual Visits (Telemedicine).  Patients are able to view lab/test results, encounter notes, upcoming appointments, etc.  Non-urgent messages can be sent to your provider as well.   ?To learn more about what you can do with MyChart, go to [email or web address].   ? ?  Your next appointment:   ?6 month(s) ? ?The format for your next appointment:   ?In Person ? ?Provider:   ?None   ? ? ? ?Important Information About Sugar ? ? ? ? ?  ?

## 2021-09-29 LAB — BASIC METABOLIC PANEL
BUN/Creatinine Ratio: 19 (ref 12–28)
BUN: 12 mg/dL (ref 8–27)
CO2: 25 mmol/L (ref 20–29)
Calcium: 9.6 mg/dL (ref 8.7–10.3)
Chloride: 103 mmol/L (ref 96–106)
Creatinine, Ser: 0.64 mg/dL (ref 0.57–1.00)
Glucose: 86 mg/dL (ref 70–99)
Potassium: 4.2 mmol/L (ref 3.5–5.2)
Sodium: 141 mmol/L (ref 134–144)
eGFR: 96 mL/min/{1.73_m2} (ref >59)

## 2021-10-25 ENCOUNTER — Telehealth (HOSPITAL_COMMUNITY): Payer: Self-pay | Admitting: *Deleted

## 2021-10-25 NOTE — Telephone Encounter (Signed)
Attempted to call patient regarding upcoming cardiac CT appointment. °Left message on voicemail with name and callback number ° °Jaquilla Woodroof RN Navigator Cardiac Imaging °[facility] °[phone] Office °[phone] Cell ° °

## 2021-10-26 ENCOUNTER — Ambulatory Visit (HOSPITAL_COMMUNITY)
Admission: RE | Admit: 2021-10-26 | Discharge: 2021-10-26 | Disposition: A | Discharge status: Home / Self Care | Payer: Medicare Other | Source: Ambulatory Visit | Attending: Cardiology | Admitting: Cardiology

## 2021-10-26 DIAGNOSIS — R002 Palpitations: Secondary | ICD-10-CM | POA: Diagnosis not present

## 2021-10-26 DIAGNOSIS — R0609 Other forms of dyspnea: Secondary | ICD-10-CM | POA: Diagnosis not present

## 2021-10-26 DIAGNOSIS — I340 Nonrheumatic mitral (valve) insufficiency: Secondary | ICD-10-CM | POA: Insufficient documentation

## 2021-10-26 DIAGNOSIS — E785 Hyperlipidemia, unspecified: Secondary | ICD-10-CM | POA: Insufficient documentation

## 2021-10-26 MED ORDER — NITROGLYCERIN 0.4 MG SL SUBL
0.8000 mg | SUBLINGUAL_TABLET | SUBLINGUAL | Status: DC | PRN
  Administered 2021-10-26: 0.8 mg via SUBLINGUAL

## 2021-10-26 MED ORDER — IOHEXOL 350 MG/ML SOLN
100.0000 mL | Freq: Once | INTRAVENOUS | Status: AC | PRN
  Administered 2021-10-26: 100 mL via INTRAVENOUS

## 2021-10-26 MED ORDER — NITROGLYCERIN 0.4 MG SL SUBL
SUBLINGUAL_TABLET | SUBLINGUAL | Status: AC
  Filled 2021-10-26: qty 2

## 2022-03-14 NOTE — Progress Notes (Unsigned)
Cardiology Office Note:    Date:  03/15/2022   ID:  Rita Petersen, DOB 1950-06-20, MRN [id number]  PCP:  Jonathon Jordan, MD  Cardiologist:  None  Electrophysiologist:  None   Referring MD: Jonathon Jordan, MD   Chief Complaint  Patient presents with   Follow-up   Shortness of Breath    History of Present Illness:    Rita Petersen is a 71 y.o. female with no significant past medical history who presents for follow-up.  She initially presented as an ED follow-up for palpitations.  She was seen in the ED on 01/11/2021 with palpitations.  Also reported exertional shortness of breath.  Work-up (including chemistry, CBC, TSH, troponins, EKG) was unremarkable.  Cardiac monitor was ordered and she was referred to cardiology.  She reports that she has been having dyspnea.  Occurs at rest or with exertion.  She does not exercise.  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  Reports occasional chest pain that she describes as sharp pain lasting for few seconds.  Has also been having issues with lightheadedness but denies any syncope.  Does report palpitations that she describes as feeling her heart is pounding.  Worse cardiac monitor x2 weeks after the ED visit.  Results pending.  She smoked for 12 years about 1 pack/day, quit age 89.  Family history includes father had CABG in 86s.  Zio patch x13 days on 02/02/2021 showed no significant arrhythmias.  Echocardiogram on 07/06/2021 showed normal biventricular function, mild to moderate MR.  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  Since last clinic visit, she reports she has been doing okay.  Chest pain has improved.  Continues to have shortness of breath occasionally, can occur at rest.  Denies any lightheadedness, syncope, or palpitations.  Reports some lower extremity edema after being on feet all day.  Past Medical History:  Diagnosis Date   Disturbance of skin sensation    Other and  unspecified hyperlipidemia    Stiffness of joint, not elsewhere classified, hand    Unspecified vitamin D deficiency     No past surgical history on file.  Current Medications: Current Meds  Medication Sig   Magnesium 400 MG CAPS Take 400 mg by mouth.     Allergies:   Cortisone   Social History   Socioeconomic History   Marital status: Married    Spouse name: Not on file   Number of children: Not on file   Years of education: Not on file   Highest education level: Not on file  Occupational History   Not on file  Tobacco Use   Smoking status: Former   Smokeless tobacco: Not on file  Substance and Sexual Activity   Alcohol use: Yes   Drug use: No   Sexual activity: Not on file  Other Topics Concern   Not on file  Social History Narrative   Not on file   Social Determinants of Health   Financial Resource Strain: Not on file  Food Insecurity: Not on file  Transportation Needs: Not on file  Physical Activity: Not on file  Stress: Not on file  Social Connections: Not on file     Family History: The patient's family history includes Alzheimer's disease in her mother; Heart Problems in her father; Hypertension in her father.  ROS:   Please see the history of present illness.     All other systems reviewed and are negative.  EKGs/Labs/Other Studies  Reviewed:    The following studies were reviewed today:   EKG:   03/15/22: Normal sinus rhythm, rate 82, no ST abnormality 09/28/21: NSR, rate 83, low voltage  Recent Labs: 09/28/2021: BUN 12; Creatinine, Ser 0.64; Potassium 4.2; Sodium 141  Recent Lipid Panel    Component Value Date/Time   CHOL 249 (H) 02/02/2021 1141   TRIG 51 02/02/2021 1141   HDL 81 02/02/2021 1141   CHOLHDL 3.1 02/02/2021 1141   LDLCALC 160 (H) 02/02/2021 1141    Physical Exam:    VS:  BP 106/68 (BP Location: Left Arm, Patient Position: Sitting, Cuff Size: Normal)   Pulse 82   Ht 5\' 2"  (1.575 m)   Wt 132 lb (59.9 kg)   SpO2 99%    BMI 24.14 kg/m     Wt Readings from Last 3 Encounters:  03/15/22 132 lb (59.9 kg)  09/28/21 134 lb (60.8 kg)  02/02/21 132 lb (59.9 kg)     GEN:  Well nourished, well developed in no acute distress HEENT: Normal NECK: No JVD; No carotid bruits CARDIAC: RRR, no murmurs, rubs, gallops RESPIRATORY:  Clear to auscultation without rales, wheezing or rhonchi  ABDOMEN: Soft, non-tender, non-distended MUSCULOSKELETAL:  No edema; No deformity  SKIN: Warm and dry NEUROLOGIC:  Alert and oriented x 3 PSYCHIATRIC:  Normal affect   ASSESSMENT:    1. DOE (dyspnea on exertion)   2. Palpitations   3. Mitral valve insufficiency, unspecified etiology   4. Hyperlipidemia, unspecified hyperlipidemia type      PLAN:    Dyspnea: Echocardiogram on 07/06/2021 showed normal biventricular function, mild to moderate MR. 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  Palpitations: Zio patch x13 days on 02/02/2021 showed no significant arrhythmias.  Reports palpitations have resolved.  Mitral regurgitation: Mild to moderate MR on echocardiogram 07/06/2021.  Will monitor  Hyperlipidemia: LDL 116 02/02/2021.  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  Strongly recommended statin but patient declines at this time  RTC in 1 year   Medication Adjustments/Labs and Tests Ordered: Current medicines are reviewed at length with the patient today.  Concerns regarding medicines are outlined above.  Orders Placed This Encounter  Procedures   EKG 12-Lead   No orders of the defined types were placed in this encounter.   Patient Instructions  Medication Instructions:  Your physician recommends that you continue on your current medications as directed. Please refer to the Current Medication list given to you today.  *If you need a refill on your cardiac medications before your next appointment, please call your  pharmacy*  Follow-Up: At [facility], you and your health needs are our priority.  As part of our continuing mission to provide you with exceptional heart care, we have created designated Provider Care Teams.  These Care Teams include your primary Cardiologist (physician) and Advanced Practice Providers (APPs -  Physician Assistants and Nurse Practitioners) who all work together to provide you with the care you need, when you need it.  We recommend signing up for the patient portal called "MyChart".  Sign up information is provided on this After Visit Summary.  MyChart is used to connect with patients for Virtual Visits (Telemedicine).  Patients are able to view lab/test results, encounter notes, upcoming appointments, etc.  Non-urgent messages can be sent to your provider as well.   To learn more about what you can do with MyChart, go to [facility].  Your next appointment:   12 month(s)  The format for your next appointment:   In Person  Provider:   Dr. Bjorn Pippin         Signed, Little Ishikawa, MD  03/15/2022 4:40 PM    [facility]

## 2022-03-15 ENCOUNTER — Ambulatory Visit: Payer: Medicare Other | Attending: Cardiology | Admitting: Cardiology

## 2022-03-15 ENCOUNTER — Encounter: Payer: Self-pay | Admitting: Cardiology

## 2022-03-15 VITALS — BP 106/68 | HR 82 | Ht 62.0 in | Wt 132.0 lb

## 2022-03-15 DIAGNOSIS — R002 Palpitations: Secondary | ICD-10-CM

## 2022-03-15 DIAGNOSIS — E785 Hyperlipidemia, unspecified: Secondary | ICD-10-CM

## 2022-03-15 DIAGNOSIS — I340 Nonrheumatic mitral (valve) insufficiency: Secondary | ICD-10-CM

## 2022-03-15 DIAGNOSIS — R0609 Other forms of dyspnea: Secondary | ICD-10-CM

## 2022-03-15 NOTE — Patient Instructions (Signed)
Medication Instructions:  Your physician recommends that you continue on your current medications as directed. Please refer to the Current Medication list given to you today.  *If you need a refill on your cardiac medications before your next appointment, please call your pharmacy*  Follow-Up: At [facility], you and your health needs are our priority.  As part of our continuing mission to provide you with exceptional heart care, we have created designated Provider Care Teams.  These Care Teams include your primary Cardiologist (physician) and Advanced Practice Providers (APPs -  Physician Assistants and Nurse Practitioners) who all work together to provide you with the care you need, when you need it.  We recommend signing up for the patient portal called "MyChart".  Sign up information is provided on this After Visit Summary.  MyChart is used to connect with patients for Virtual Visits (Telemedicine).  Patients are able to view lab/test results, encounter notes, upcoming appointments, etc.  Non-urgent messages can be sent to your provider as well.   To learn more about what you can do with MyChart, go to [email or web address].    Your next appointment:   12 month(s)  The format for your next appointment:   In Person  Provider:   Dr. Schumann       

## 2023-02-07 IMAGING — CT CT HEART MORP W/ CTA COR W/ SCORE W/ CA W/CM &/OR W/O CM
4 of 7 series · 8 of 20 positions shown, 9 images · IV contrast (APPLIED)
Comparison: None.

Addendum:
CLINICAL DATA: 70 year old white female

EXAM:
Cardiac/Coronary  CTA
TECHNIQUE: The patient was scanned on a [employer] scanner.

[Series 6: ts diast sharp · axial · 0.39mm/px · z∈[-502,-458]mm · 2 of 331 slices shown]
[im 111/331  lung]
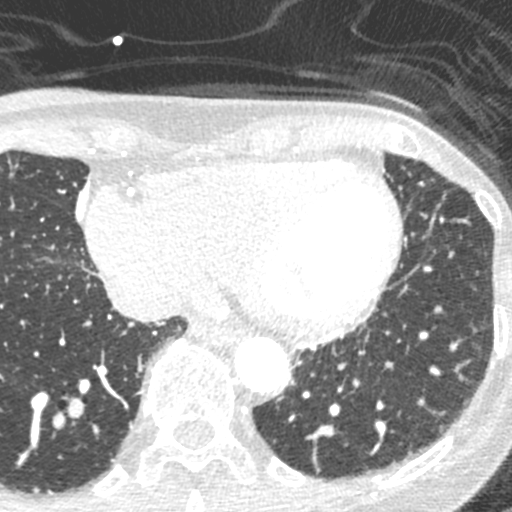
[im 221/331  lung]
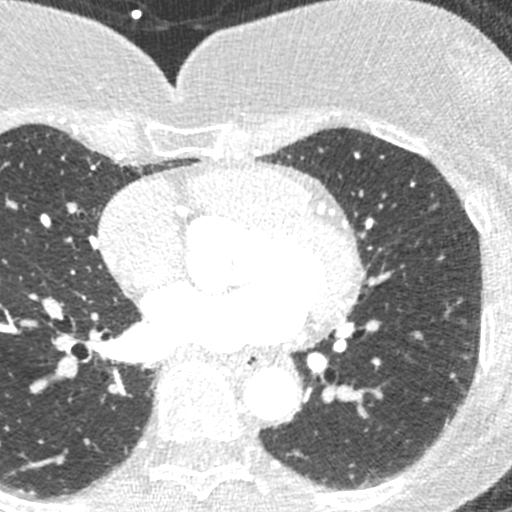

[Series 7: best diast · axial · 0.39mm/px · z∈[-502,-458]mm · 2 of 331 slices shown, 3 images]
[im 111/331  vessel]
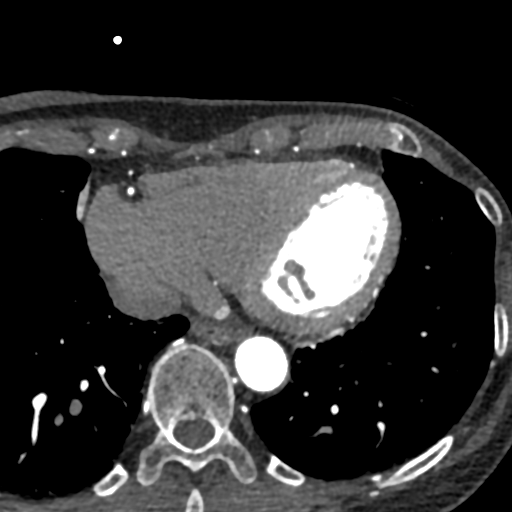
[im 111/331  lung]
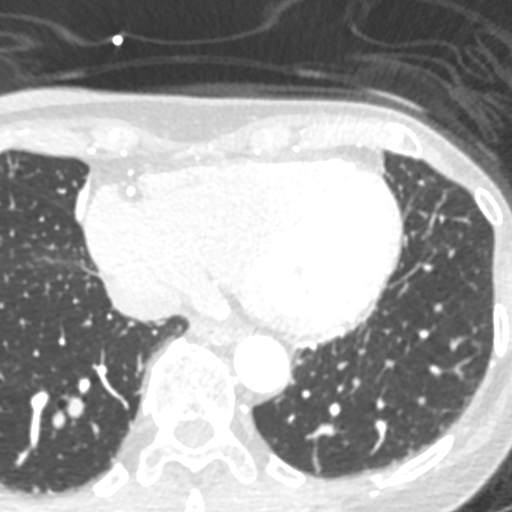
[im 221/331  vessel]
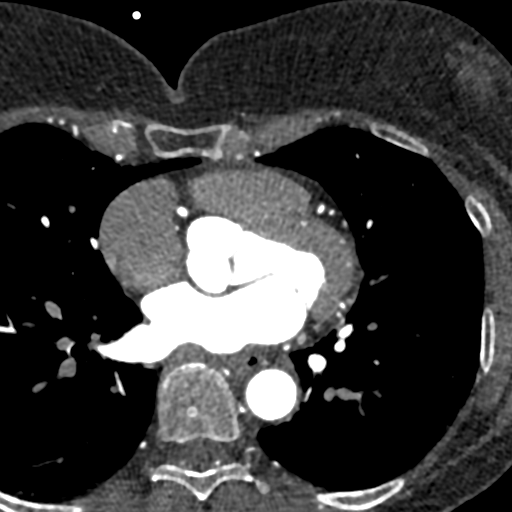

[Series 8: ts syst sharp · axial · 0.39mm/px · z∈[-502,-458]mm · 2 of 331 slices shown]
[im 111/331  lung]
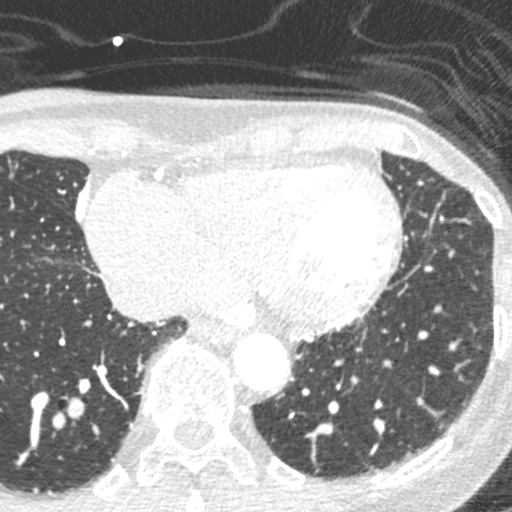
[im 221/331  lung]
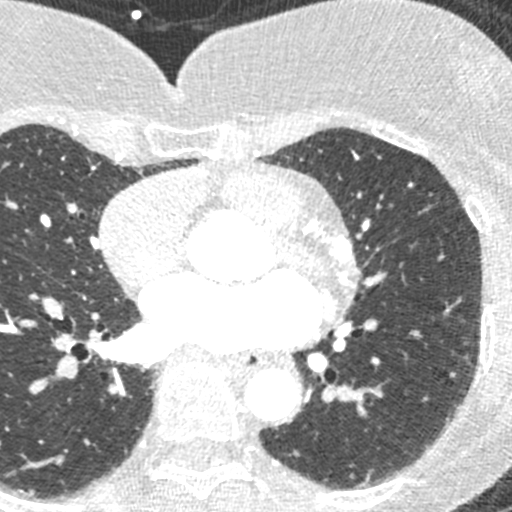

[Series 9: best syst · axial · 0.39mm/px · z∈[-502,-458]mm · 2 of 331 slices shown]
[im 111/331  vessel]
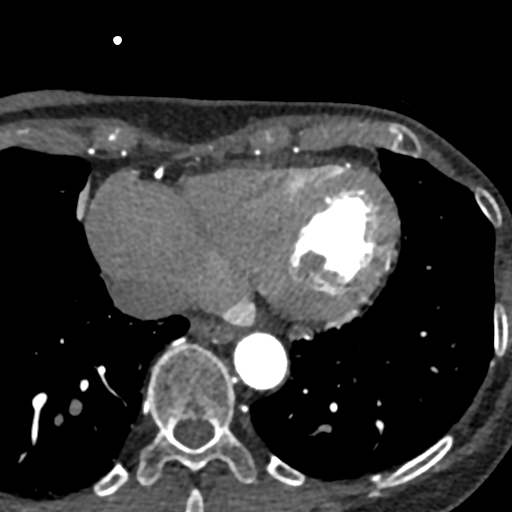
[im 221/331  vessel]
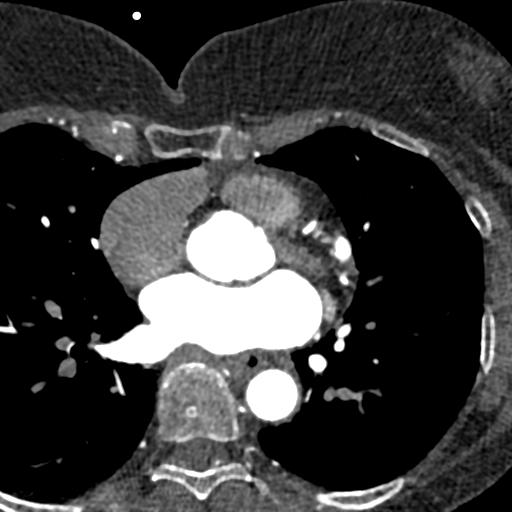

[8 of 20 positions shown; findings below may reference images not displayed]

FINDINGS: Scan was triggered in the descending thoracic aorta. Axial
non-contrast 3 mm slices were carried out through the heart. The
data set was analyzed on a dedicated work station and scored using
the Agatson method. Gantry rotation speed was 250 msecs and
collimation was .6 mm. 0.8 mg of sl NTG was given. The 3D data set
was reconstructed in 5% intervals of the 67-82 % of the R-R cycle.
Diastolic phases were analyzed on a dedicated work station using
MPR, MIP and VRT modes. The patient received 100 cc of contrast.

Aorta:  Normal size.  Aortic atherosclerosis.  No dissection.

Main Pulmonary Artery: Normal size of the pulmonary artery.

Aortic Valve:  Tri-leaflet.  No calcifications.

Coronary Arteries:  Normal coronary origin.  Right dominance.

Coronary Calcium Score:

Left main: 0

Left anterior descending artery: 31

Left circumflex artery: 0

Right coronary artery: 0

Ramus intermedius artery: 0

Total: 31

Percentile: 62nd for age, sex, and race matched control.

RCA is a large dominant artery that gives rise to PDA and PLA. There
is no significant plaque. Mid RCA slab artifact.

Left main is a large artery that gives rise to LAD and LCX arteries.
There is no significant plaque.

LAD is a large vessel that gives rise to two diagonal vessels.
Minimal non obstructive calcified plaque proximal LAD.

LCX is a non-dominant artery that gives rise to one OM1 branch. Mild
luminal narrowing at the OM1 take off without discrete plaque.

There is a ramus intermedius vessel is a small vessel.

Other findings:

Normal pulmonary vein drainage into the left atrium.

Normal left atrial appendage without a thrombus.

Extra-cardiac findings: See attached radiology report for
non-cardiac structures.

Body motion slab artifact.
IMPRESSION: 1. Coronary calcium score of 31. This was 62nd percentile for age,
sex, and race matched control.

2. Normal coronary origin with right dominance.

3. CAD-RADS 2. Mild non-obstructive CAD (25-49%). Consider
non-atherosclerotic causes of chest pain. Consider preventive
therapy and risk factor modification.

4. Aortic atherosclerosis.

RECOMMENDATIONS:



If CAC = 0, it is reasonable to withhold statin therapy and reassess
in 5 to 10 years, as long as higher risk conditions are absent
(diabetes mellitus, family history of premature CHD in first degree
relatives (males <55 years; females <65 years), cigarette smoking,
LDL >=190 mg/dL or other independent risk factors).

If CAC is 1 to 99, it is reasonable to initiate statin therapy for
patients >=55 years of age.

If CAC is >=100 or >=75th percentile, it is reasonable to initiate
statin therapy at any age.

Cardiology referral should be considered for patients with CAC
scores =400 or >=75th percentile.

*0383 AHA/ACC/AACVPR/AAPA/ABC/KOMI/BLAIN/ERNANES/Steven/AREAL/BEZAWADA/SALANJAT
Guideline on the Management of Blood Cholesterol: A Report of the
American College of Cardiology/American Heart Association Task Force
on Clinical Practice Guidelines. J Am Coll Cardiol.
1782;73(24):1842-1916.

EXAM:
OVER-READ INTERPRETATION  CT CHEST

The following report is an over-read performed by radiologist Dr.
over-read does not include interpretation of cardiac or coronary
anatomy or pathology. The CTA interpretation by the cardiologist is
attached.
FINDINGS: Limited view of the lung parenchyma demonstrates no suspicious
nodularity. Airways are normal.

Limited view of the mediastinum demonstrates no adenopathy.
Esophagus normal.

Limited view of the upper abdomen unremarkable.

Limited view of the skeleton and chest wall is unremarkable.
IMPRESSION: No significant extracardiac findings.

*** End of Addendum ***
FINDINGS: Scan was triggered in the descending thoracic aorta. Axial
non-contrast 3 mm slices were carried out through the heart. The
data set was analyzed on a dedicated work station and scored using
the Agatson method. Gantry rotation speed was 250 msecs and
collimation was .6 mm. 0.8 mg of sl NTG was given. The 3D data set
was reconstructed in 5% intervals of the 67-82 % of the R-R cycle.
Diastolic phases were analyzed on a dedicated work station using
MPR, MIP and VRT modes. The patient received 100 cc of contrast.

Aorta:  Normal size.  Aortic atherosclerosis.  No dissection.

Main Pulmonary Artery: Normal size of the pulmonary artery.

Aortic Valve:  Tri-leaflet.  No calcifications.

Coronary Arteries:  Normal coronary origin.  Right dominance.

Coronary Calcium Score:

Left main: 0

Left anterior descending artery: 31

Left circumflex artery: 0

Right coronary artery: 0

Ramus intermedius artery: 0

Total: 31

Percentile: 62nd for age, sex, and race matched control.

RCA is a large dominant artery that gives rise to PDA and PLA. There
is no significant plaque. Mid RCA slab artifact.

Left main is a large artery that gives rise to LAD and LCX arteries.
There is no significant plaque.

LAD is a large vessel that gives rise to two diagonal vessels.
Minimal non obstructive calcified plaque proximal LAD.

LCX is a non-dominant artery that gives rise to one OM1 branch. Mild
luminal narrowing at the OM1 take off without discrete plaque.

There is a ramus intermedius vessel is a small vessel.

Other findings:

Normal pulmonary vein drainage into the left atrium.

Normal left atrial appendage without a thrombus.

Extra-cardiac findings: See attached radiology report for
non-cardiac structures.

Body motion slab artifact.
IMPRESSION: 1. Coronary calcium score of 31. This was 62nd percentile for age,
sex, and race matched control.

2. Normal coronary origin with right dominance.

3. CAD-RADS 2. Mild non-obstructive CAD (25-49%). Consider
non-atherosclerotic causes of chest pain. Consider preventive
therapy and risk factor modification.

4. Aortic atherosclerosis.

RECOMMENDATIONS:



If CAC = 0, it is reasonable to withhold statin therapy and reassess
in 5 to 10 years, as long as higher risk conditions are absent
(diabetes mellitus, family history of premature CHD in first degree
relatives (males <55 years; females <65 years), cigarette smoking,
LDL >=190 mg/dL or other independent risk factors).

If CAC is 1 to 99, it is reasonable to initiate statin therapy for
patients >=55 years of age.

If CAC is >=100 or >=75th percentile, it is reasonable to initiate
statin therapy at any age.

Cardiology referral should be considered for patients with CAC
scores =400 or >=75th percentile.

*0383 AHA/ACC/AACVPR/AAPA/ABC/KOMI/BLAIN/ERNANES/Steven/AREAL/BEZAWADA/SALANJAT
Guideline on the Management of Blood Cholesterol: A Report of the
American College of Cardiology/American Heart Association Task Force
on Clinical Practice Guidelines. J Am Coll Cardiol.
1782;73(24):1842-1916.

## 2023-11-28 DIAGNOSIS — H109 Unspecified conjunctivitis: Secondary | ICD-10-CM | POA: Diagnosis not present
